# Patient Record
Sex: Female | Born: 1995 | Hispanic: Refuse to answer | Marital: Single
Health system: Southern US, Community
[De-identification: ages and names within clinical notes are randomized; demographics above are authoritative.]

---

## 2019-09-15 ENCOUNTER — Ambulatory Visit: Payer: Self-pay

## 2019-10-06 ENCOUNTER — Ambulatory Visit: Payer: Self-pay | Attending: Family

## 2019-10-06 ENCOUNTER — Ambulatory Visit: Payer: Self-pay

## 2019-10-06 DIAGNOSIS — Z23 Encounter for immunization: Secondary | ICD-10-CM

## 2019-10-06 NOTE — Progress Notes (Signed)
   Covid-19 Vaccination Clinic  Name:  Jaclyne Pullum    MRN: [id number] DOB: 05-13-1996  10/06/2019  Ms. Gallas was observed post Covid-19 immunization for 15 minutes without incident. She was provided with Vaccine Information Sheet and instruction to access the V-Safe system.   Ms. Seamon was instructed to call 911 with any severe reactions post vaccine: Marland Kitchen Difficulty breathing  . Swelling of face and throat  . A fast heartbeat  . A bad rash all over body  . Dizziness and weakness   Immunizations Administered    Name Date Dose VIS Date Route   Moderna COVID-19 Vaccine 10/06/2019  2:31 PM 0.5 mL 05/2019 Intramuscular   Manufacturer: Moderna   Lot: [id number]   [id number]

## 2019-11-01 ENCOUNTER — Ambulatory Visit: Payer: Self-pay | Attending: Family

## 2019-11-01 DIAGNOSIS — Z23 Encounter for immunization: Secondary | ICD-10-CM

## 2019-11-01 NOTE — Progress Notes (Signed)
   Covid-19 Vaccination Clinic  Name:  Tamara Moreno    MRN: [id number] DOB: 1995-12-29  11/01/2019  [name] was observed post Covid-19 immunization for 15 minutes without incident. She was provided with Vaccine Information Sheet and instruction to access the V-Safe system.   [name] was instructed to call 911 with any severe reactions post vaccine: Marland Kitchen Difficulty breathing  . Swelling of face and throat  . A fast heartbeat  . A bad rash all over body  . Dizziness and weakness   Immunizations Administered    Name Date Dose VIS Date Route   Moderna COVID-19 Vaccine 11/01/2019  2:56 PM 0.5 mL 05/2019 Intramuscular   Manufacturer: Moderna   Lot: [id number]   [id number]

## 2021-01-07 ENCOUNTER — Other Ambulatory Visit: Payer: Self-pay | Admitting: Student in an Organized Health Care Education/Training Program

## 2021-01-07 DIAGNOSIS — N6320 Unspecified lump in the left breast, unspecified quadrant: Secondary | ICD-10-CM

## 2021-01-09 ENCOUNTER — Other Ambulatory Visit: Payer: Self-pay

## 2021-01-09 ENCOUNTER — Ambulatory Visit
Admission: RE | Admit: 2021-01-09 | Discharge: 2021-01-09 | Disposition: A | Discharge status: Home / Self Care | Payer: PRIVATE HEALTH INSURANCE | Source: Ambulatory Visit | Attending: Student in an Organized Health Care Education/Training Program | Admitting: Student in an Organized Health Care Education/Training Program

## 2021-01-09 DIAGNOSIS — N6320 Unspecified lump in the left breast, unspecified quadrant: Secondary | ICD-10-CM

## 2021-09-05 ENCOUNTER — Other Ambulatory Visit: Payer: Self-pay | Admitting: Nurse Practitioner

## 2021-09-05 DIAGNOSIS — D242 Benign neoplasm of left breast: Secondary | ICD-10-CM

## 2021-09-18 ENCOUNTER — Other Ambulatory Visit: Payer: PRIVATE HEALTH INSURANCE

## 2021-09-27 ENCOUNTER — Other Ambulatory Visit: Payer: Self-pay | Admitting: Nurse Practitioner

## 2021-09-27 ENCOUNTER — Ambulatory Visit
Admission: RE | Admit: 2021-09-27 | Discharge: 2021-09-27 | Disposition: A | Discharge status: Home / Self Care | Payer: Commercial Managed Care - PPO | Source: Ambulatory Visit | Attending: Nurse Practitioner | Admitting: Nurse Practitioner

## 2021-09-27 DIAGNOSIS — D242 Benign neoplasm of left breast: Secondary | ICD-10-CM

## 2021-09-27 DIAGNOSIS — N6320 Unspecified lump in the left breast, unspecified quadrant: Secondary | ICD-10-CM

## 2022-04-01 ENCOUNTER — Other Ambulatory Visit: Payer: Commercial Managed Care - PPO

## 2023-05-20 IMAGING — US US BREAST*L* LIMITED INC AXILLA
1 series · 4 of 4 positions shown · non-contrast
Comparison: LEFT breast ultrasound dated 01/09/2021.

CLINICAL DATA: Follow-up for probably benign fibroadenoma of the
LEFT breast.

EXAM:
ULTRASOUND OF THE LEFT BREAST

[Series 1: us breast*left* limited inc axilla · 0.07mm/px · 4 of 4 slices shown]
[im 1/4]
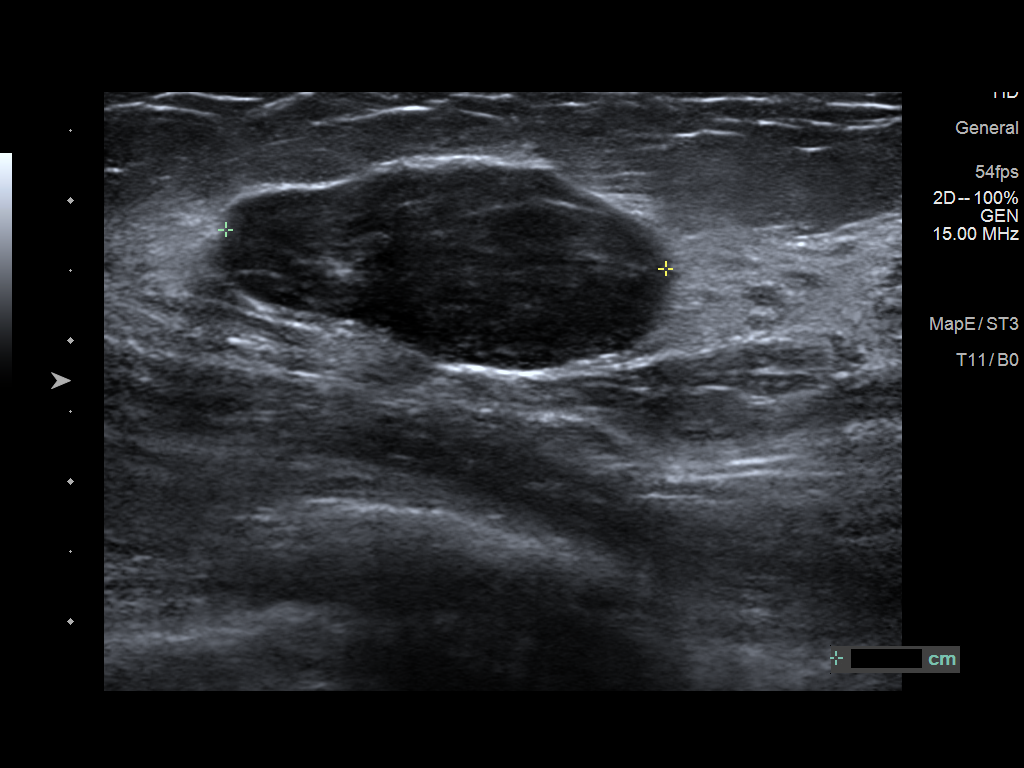
[im 2/4]
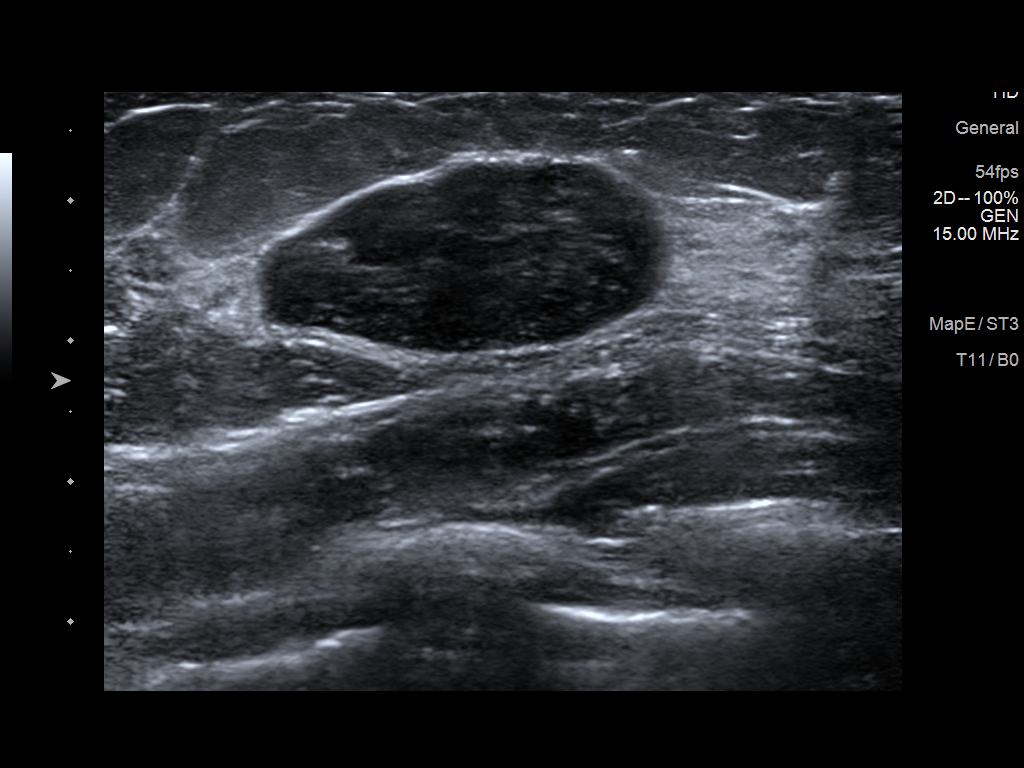
[im 3/4]
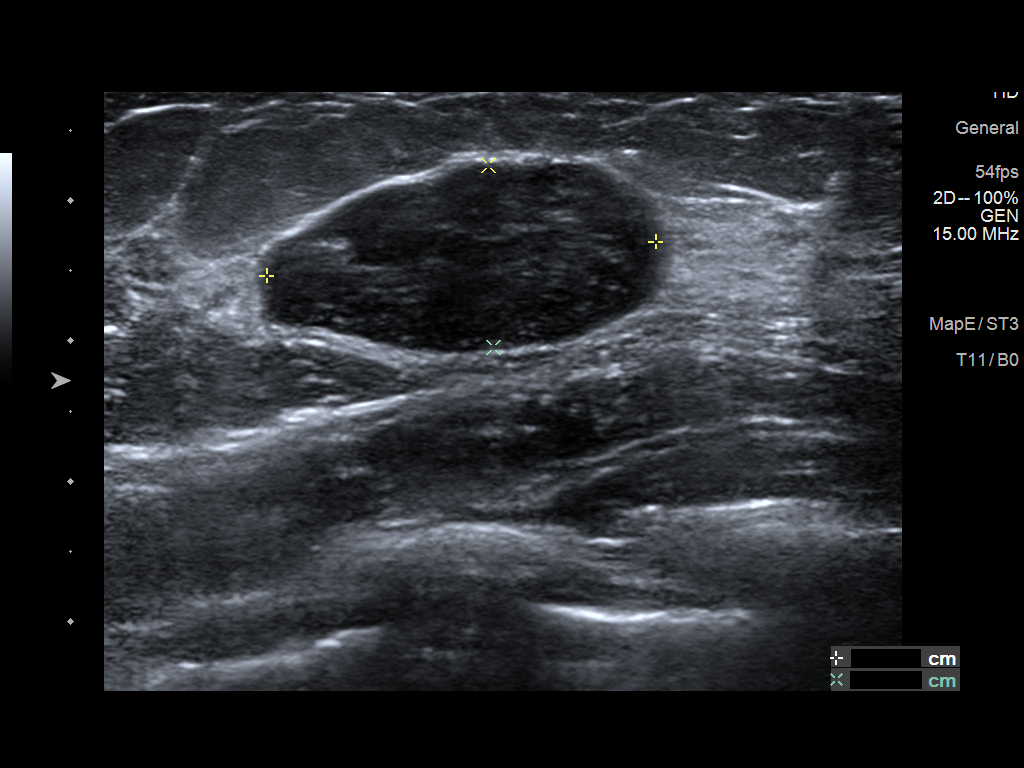
[im 4/4]
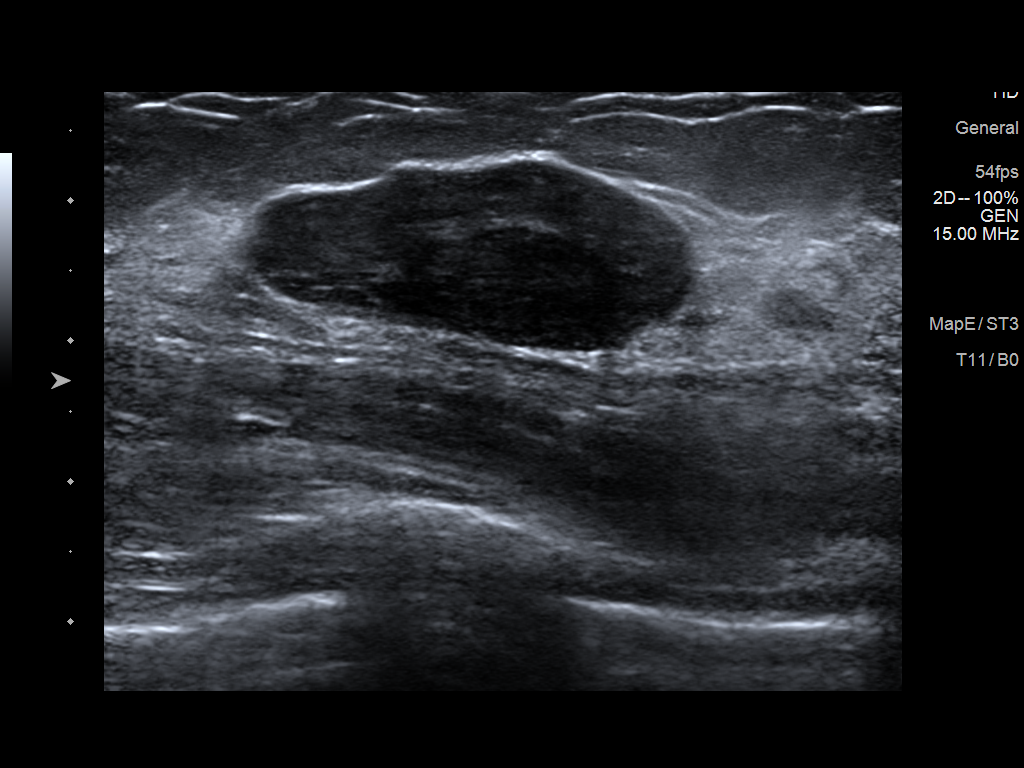

[4 of 4 positions shown; findings below may reference images not displayed]

FINDINGS: Targeted ultrasound is performed, again showing an oval
circumscribed hypoechoic mass in the LEFT breast at the 12 o'clock
axis, 5 cm from the nipple, measuring 3.2 x 1.3 x 2.8 cm, not
significantly changed compared to the previous exam.
IMPRESSION: Probably benign mass in the LEFT breast at the 12 o'clock axis, 5 cm
from the nipple, measuring 3.2 x 1.3 x 2.8 cm, not significantly
changed compared to the previous exam, presumed fibroadenoma.
Recommend additional follow-up ultrasound in 6 months to ensure
stability.

RECOMMENDATION:
LEFT breast ultrasound in 6 months.

I have discussed the findings and recommendations with the patient.
If applicable, a reminder letter will be sent to the patient
regarding the next appointment.

BI-RADS CATEGORY  3: Probably benign.
